# Patient Record
Sex: Male | Born: 1970 | Race: White | Hispanic: No | Marital: Single | Smoking: Current every day smoker
Health system: Southern US, Community
[De-identification: ages and names within clinical notes are randomized; demographics above are authoritative.]

---

## 1997-11-23 ENCOUNTER — Emergency Department (HOSPITAL_COMMUNITY): Admission: EM | Admit: 1997-11-23 | Discharge: 1997-11-23 | Payer: Self-pay | Admitting: Emergency Medicine

## 1998-01-01 ENCOUNTER — Ambulatory Visit (HOSPITAL_COMMUNITY): Admission: RE | Admit: 1998-01-01 | Discharge: 1998-01-01 | Payer: Self-pay | Admitting: General Surgery

## 1998-11-11 ENCOUNTER — Encounter: Payer: Self-pay | Admitting: Emergency Medicine

## 1998-11-11 ENCOUNTER — Emergency Department (HOSPITAL_COMMUNITY): Admission: EM | Admit: 1998-11-11 | Discharge: 1998-11-11 | Payer: Self-pay | Admitting: Emergency Medicine

## 2007-06-04 ENCOUNTER — Ambulatory Visit (HOSPITAL_COMMUNITY): Admission: RE | Admit: 2007-06-04 | Discharge: 2007-06-04 | Payer: Self-pay | Admitting: Emergency Medicine

## 2008-05-05 IMAGING — CT CT HEAD W/O CM
1 series · 16 of 30 positions shown, 20 images · non-contrast
Comparison: 

CLINICAL DATA: RECENT SYNCOPAL EPISODE; HEADACHE

CT HEAD WITHOUT CONTRAST
TECHNIQUE: Contiguous axial images were obtained from the base of
the skull through the vertex without contrast

[Series 2: brain · axial · 0.47mm/px · z∈[+121,+261]mm · 16 of 32 slices shown, 20 images]
[im 2/32  brain]
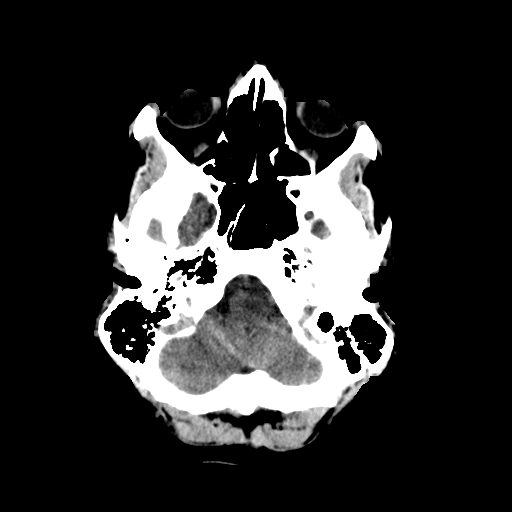
[im 2/32  bone]
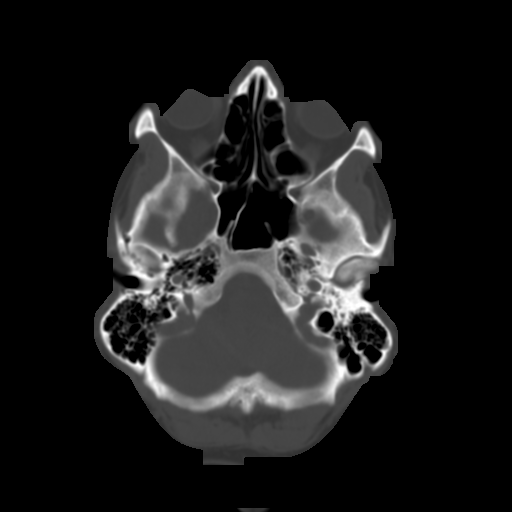
[im 4/32  brain]
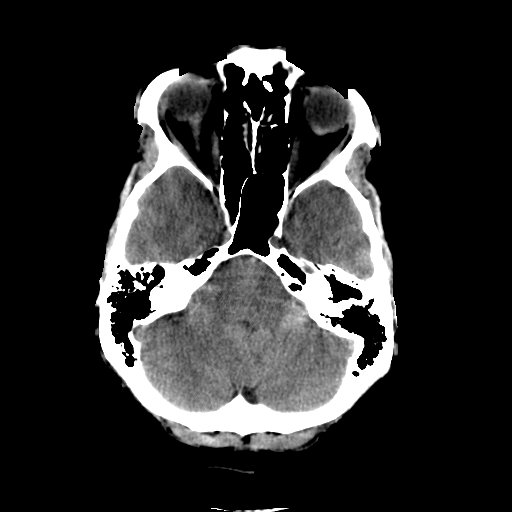
[im 6/32  brain]
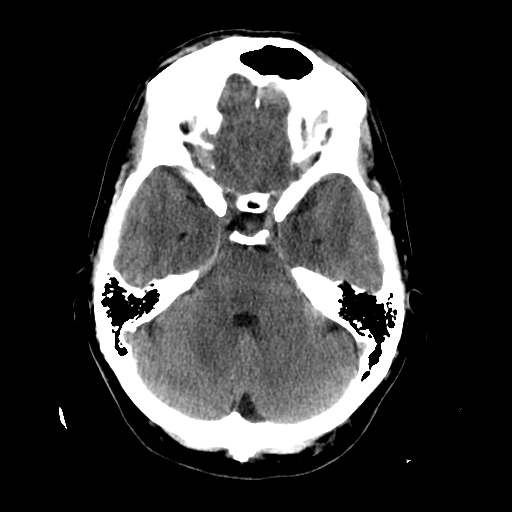
[im 8/32  brain]
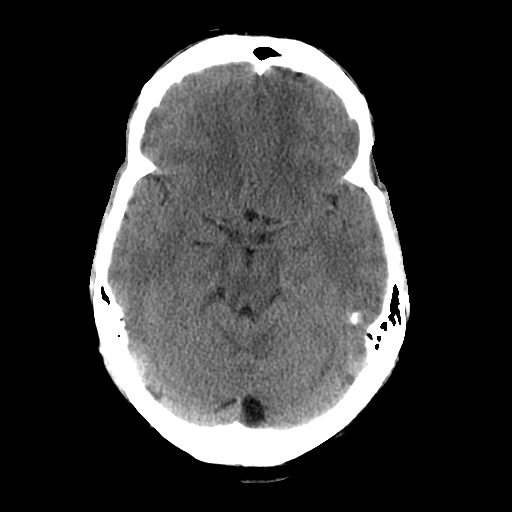
[im 9/32  brain]
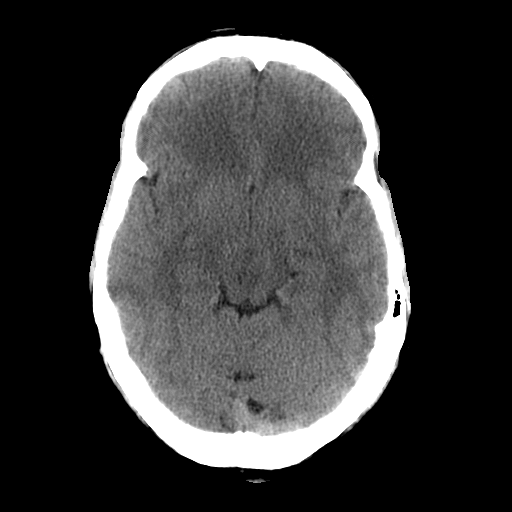
[im 9/32  bone]
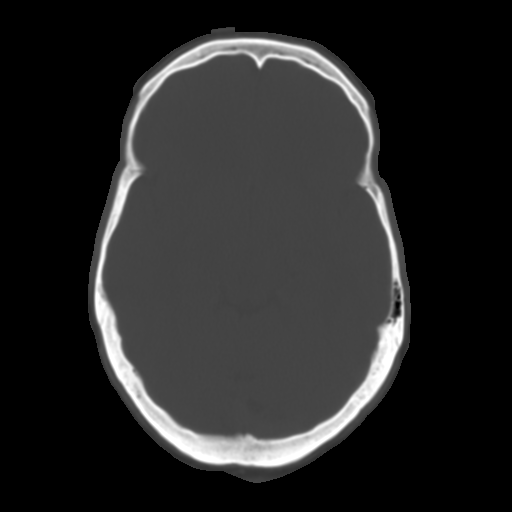
[im 11/32  brain]
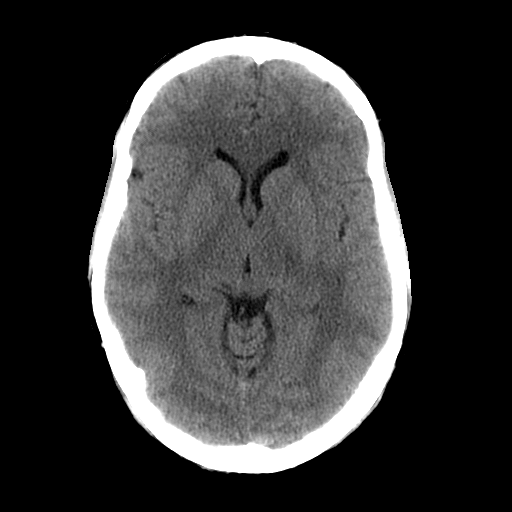
[im 13/32  brain]
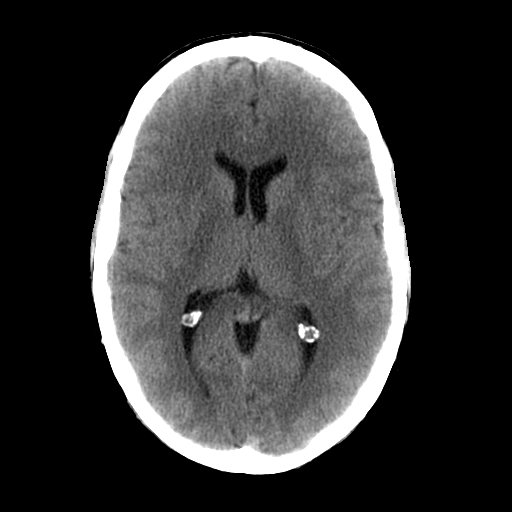
[im 15/32  brain]
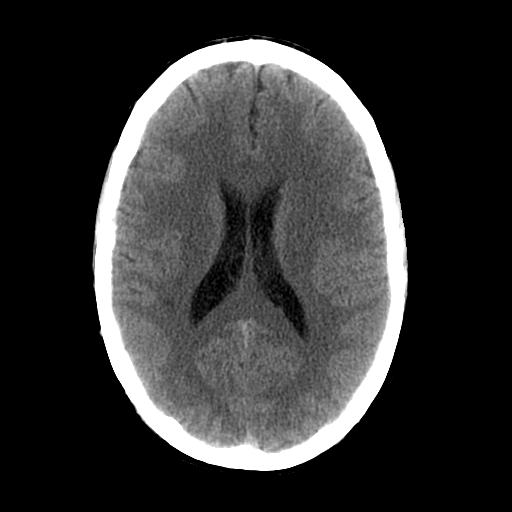
[im 17/32  brain]
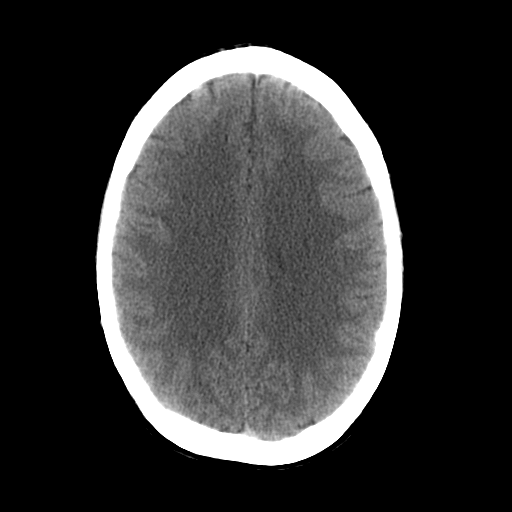
[im 17/32  bone]
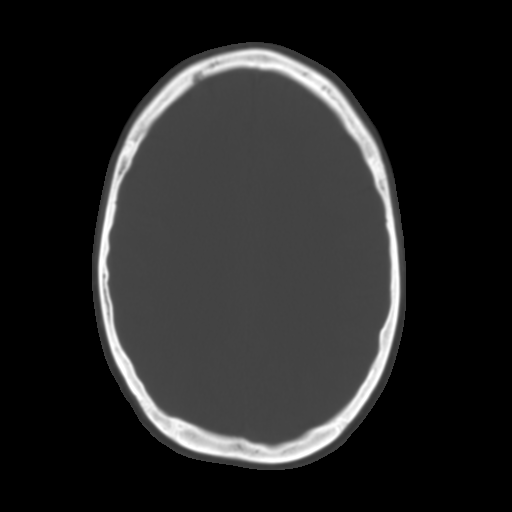
[im 19/32  brain]
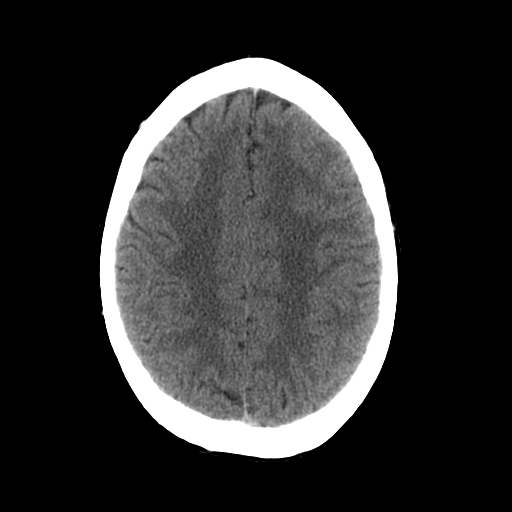
[im 21/32  brain]
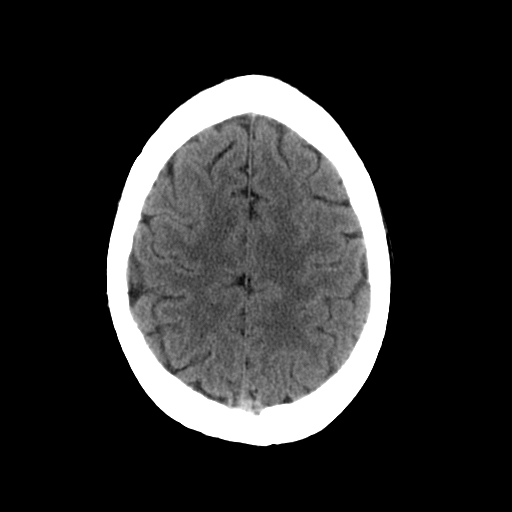
[im 23/32  brain]
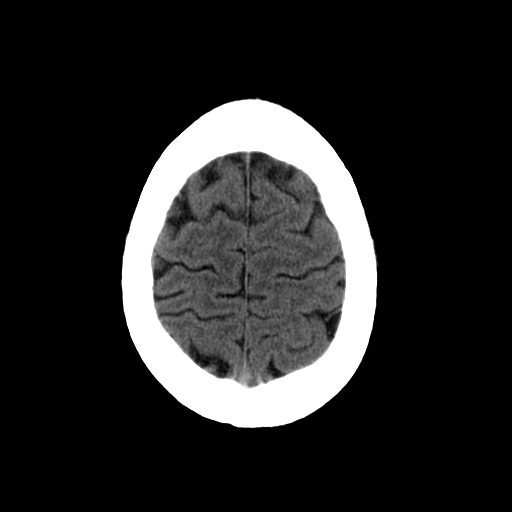
[im 24/32  brain]
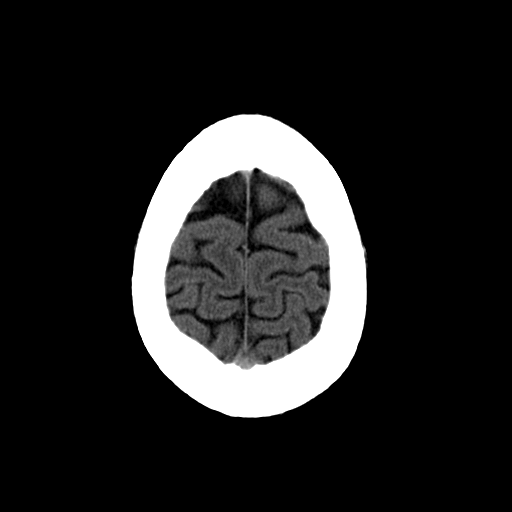
[im 24/32  bone]
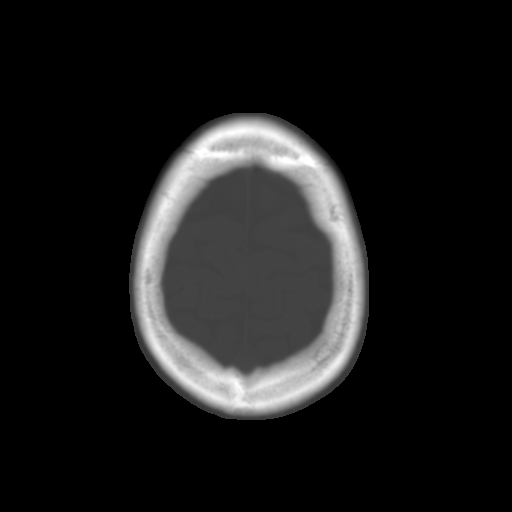
[im 26/32  brain]
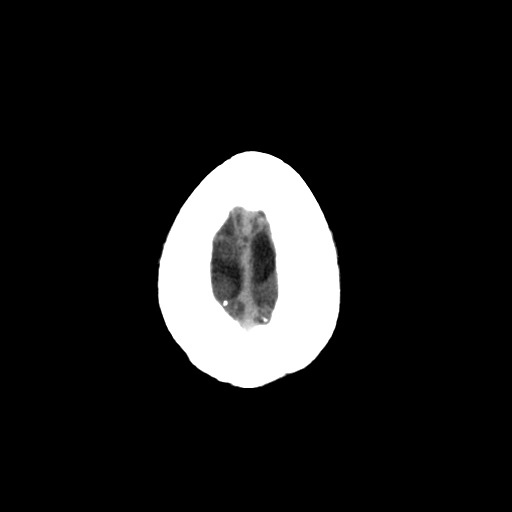
[im 28/32  brain]
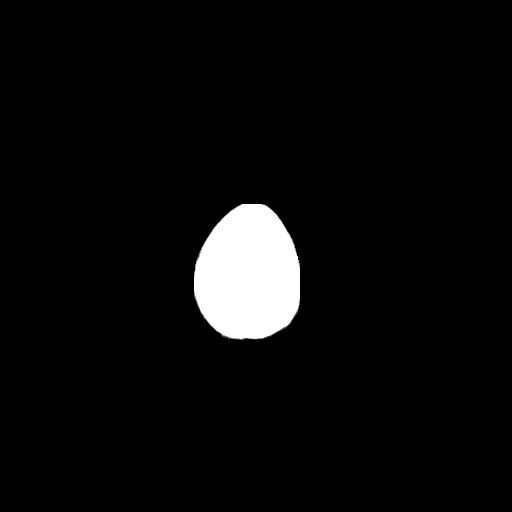
[im 30/32  brain]
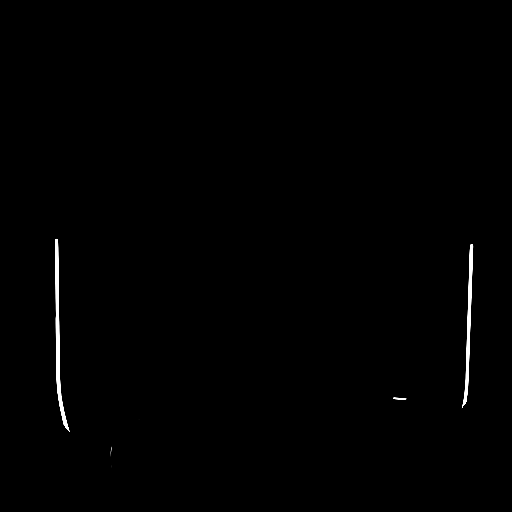

[16 of 30 positions shown; findings below may reference images not displayed]

FINDINGS: The brain has a normal appearance without evidence for
hemorrhage, acute infarction, hydrocephalus, or mass lesion.  There
is no extra axial fluid collection.  The skull and paranasal
sinuses are normal. Mucosal thickening involving the left maxillary
sinus.
IMPRESSION: Normal CT of the head without contrast.

## 2010-03-17 ENCOUNTER — Emergency Department (HOSPITAL_COMMUNITY)
Admission: EM | Admit: 2010-03-17 | Discharge: 2010-03-18 | Payer: Self-pay | Source: Home / Self Care | Admitting: Emergency Medicine

## 2010-03-22 LAB — DIFFERENTIAL
Basophils Absolute: 0 10*3/uL (ref 0.0–0.1)
Basophils Relative: 0 % (ref 0–1)
Eosinophils Absolute: 0 10*3/uL (ref 0.0–0.7)
Eosinophils Relative: 0 % (ref 0–5)
Lymphocytes Relative: 8 % — ABNORMAL LOW (ref 12–46)
Lymphs Abs: 1.4 10*3/uL (ref 0.7–4.0)
Monocytes Absolute: 1.4 10*3/uL — ABNORMAL HIGH (ref 0.1–1.0)
Monocytes Relative: 8 % (ref 3–12)
Neutro Abs: 13.7 10*3/uL — ABNORMAL HIGH (ref 1.7–7.7)
Neutrophils Relative %: 83 % — ABNORMAL HIGH (ref 43–77)

## 2010-03-22 LAB — URINALYSIS, ROUTINE W REFLEX MICROSCOPIC
Bilirubin Urine: NEGATIVE
Hgb urine dipstick: NEGATIVE
Ketones, ur: 15 mg/dL — AB
Nitrite: NEGATIVE
Protein, ur: NEGATIVE mg/dL
Specific Gravity, Urine: 1.03 (ref 1.005–1.030)
Urine Glucose, Fasting: NEGATIVE mg/dL
Urobilinogen, UA: 1 mg/dL (ref 0.0–1.0)
pH: 5.5 (ref 5.0–8.0)

## 2010-03-22 LAB — CBC
HCT: 43.2 % (ref 39.0–52.0)
Hemoglobin: 15.7 g/dL (ref 13.0–17.0)
MCH: 32.6 pg (ref 26.0–34.0)
MCHC: 36.3 g/dL — ABNORMAL HIGH (ref 30.0–36.0)
MCV: 89.6 fL (ref 78.0–100.0)
Platelets: 260 10*3/uL (ref 150–400)
RBC: 4.82 MIL/uL (ref 4.22–5.81)
RDW: 12.6 % (ref 11.5–15.5)
WBC: 16.6 10*3/uL — ABNORMAL HIGH (ref 4.0–10.5)

## 2010-03-22 LAB — BASIC METABOLIC PANEL
BUN: 14 mg/dL (ref 6–23)
CO2: 27 mEq/L (ref 19–32)
Calcium: 9.2 mg/dL (ref 8.4–10.5)
Chloride: 98 mEq/L (ref 96–112)
Creatinine, Ser: 0.89 mg/dL (ref 0.4–1.5)
GFR calc Af Amer: >60 mL/min (ref >60)
GFR calc non Af Amer: >60 mL/min (ref >60)
Glucose, Bld: 108 mg/dL — ABNORMAL HIGH (ref 70–99)
Potassium: 4.6 mEq/L (ref 3.5–5.1)
Sodium: 134 mEq/L — ABNORMAL LOW (ref 135–145)

## 2011-02-17 IMAGING — CR DG CHEST 2V
2 series · 2 of 2 positions shown · non-contrast
Comparison: None

CLINICAL DATA: Chest and back pain.

CHEST - 2 VIEW

[w chest pa]
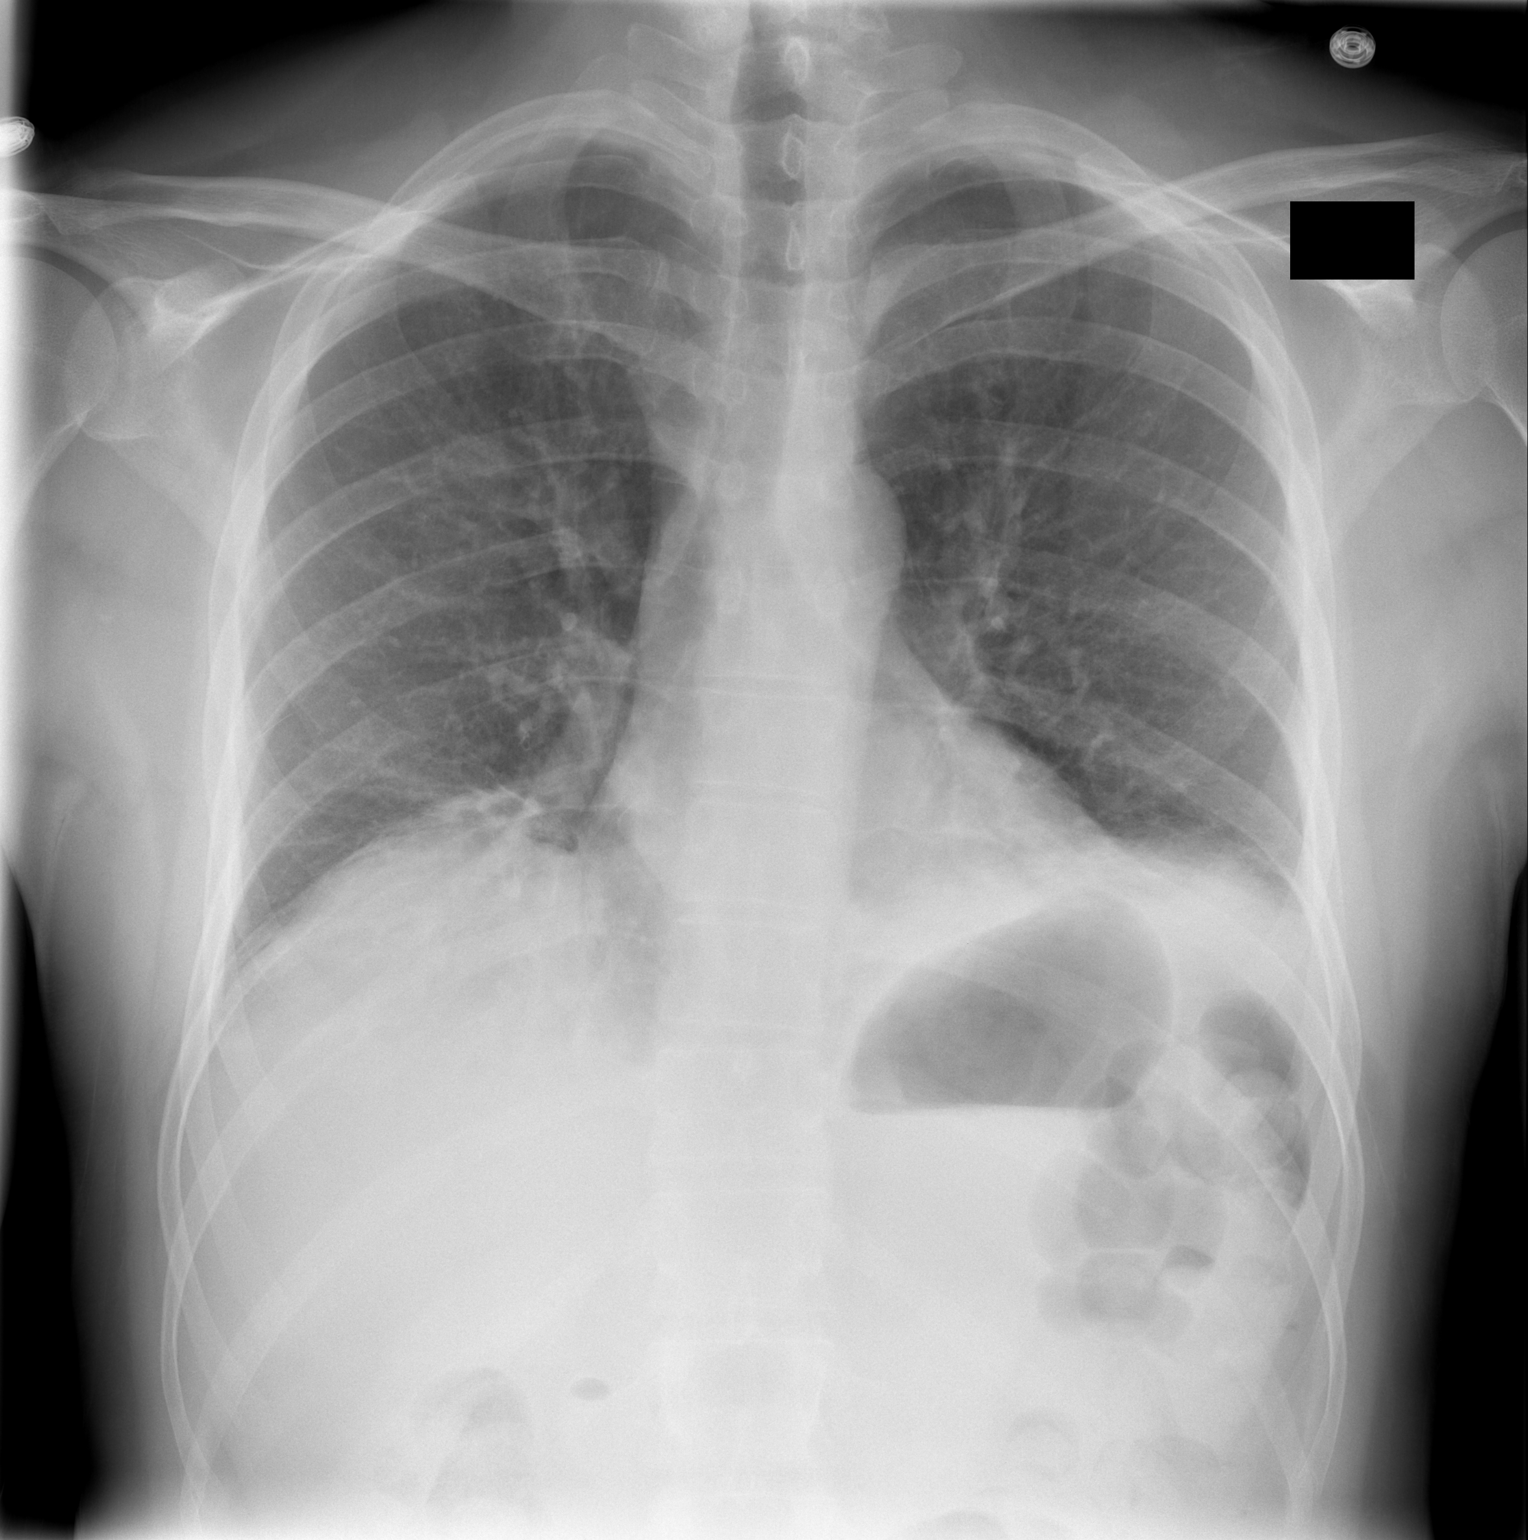

[w chest lat]
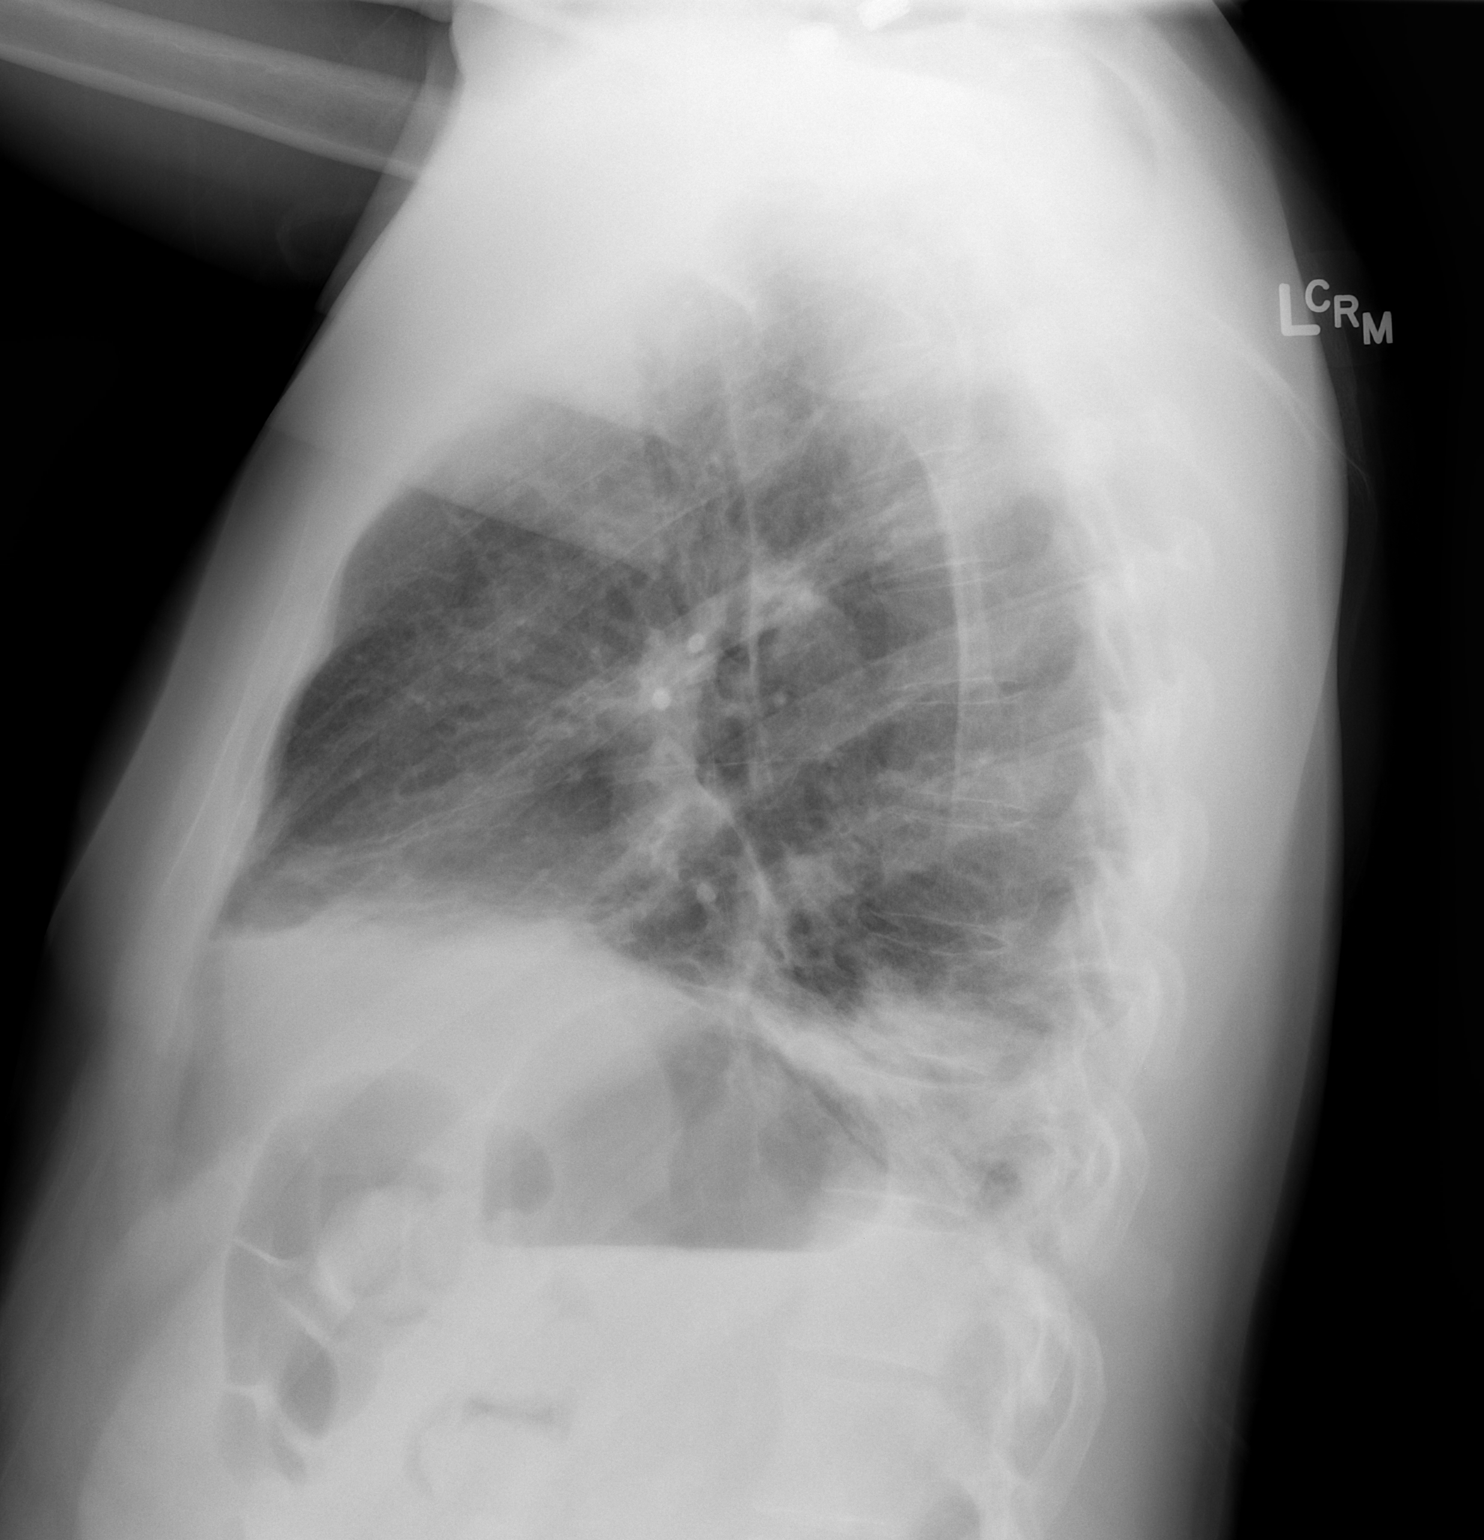

[2 of 2 positions shown; findings below may reference images not displayed]

FINDINGS: The cardiac silhouette, mediastinal and hilar contours
are within normal limits.  Low lung volumes with vascular crowding
and bibasilar atelectasis or infiltrates.  Probable small left
pleural effusion.  No pneumothorax.  The bony thorax is intact.
IMPRESSION: Bibasilar infiltrates versus atelectasis and probable small left
pleural effusion.

## 2011-05-07 ENCOUNTER — Encounter: Payer: Self-pay | Admitting: Family Medicine

## 2011-05-09 ENCOUNTER — Encounter: Payer: Self-pay | Admitting: Family Medicine

## 2011-05-09 ENCOUNTER — Ambulatory Visit (INDEPENDENT_AMBULATORY_CARE_PROVIDER_SITE_OTHER): Payer: BC Managed Care – PPO | Admitting: Family Medicine

## 2011-05-09 DIAGNOSIS — F418 Other specified anxiety disorders: Secondary | ICD-10-CM

## 2011-05-09 DIAGNOSIS — R5381 Other malaise: Secondary | ICD-10-CM

## 2011-05-09 DIAGNOSIS — L301 Dyshidrosis [pompholyx]: Secondary | ICD-10-CM

## 2011-05-09 DIAGNOSIS — E039 Hypothyroidism, unspecified: Secondary | ICD-10-CM

## 2011-05-09 DIAGNOSIS — G8929 Other chronic pain: Secondary | ICD-10-CM

## 2011-05-09 DIAGNOSIS — R5383 Other fatigue: Secondary | ICD-10-CM

## 2011-05-09 MED ORDER — HYDROCORTISONE VALERATE 0.2 % EX CREA: TOPICAL_CREAM | Freq: Two times a day (BID) | CUTANEOUS | Status: AC

## 2011-05-09 MED ORDER — LEVOTHYROXINE SODIUM 50 MCG PO TABS: 50.0000 ug | ORAL_TABLET | Freq: Every day | ORAL | Status: AC

## 2011-05-09 NOTE — Progress Notes (Signed)
  Subjective:    Patient ID: Kyle Booth, male    DOB: 1970/12/07, 41 y.o.   MRN: [id number]  HPI Patient presents with rash of hands and soles of feet.  Not pruritic however at times does burn.  Patient acknowledges that his hands sweat profusely particularly when he is nervous. Interested in dermatology refferal.  Has tried OTC cortisone without success.   Patient needing TSH check and refills of synthroid.  Chronic pain being cared for by [facility] pain center in [place], [place].  Advised by pain management to have testosterone level checked.  Evaluate mole on (L) temporal region that is changing in size and recently has cracked.   Review of Systems     Objective:   Physical Exam  Constitutional: He appears well-developed and well-nourished.  HENT:  Head: Normocephalic and atraumatic.  Eyes: EOM are normal.  Neck: Neck supple. No thyromegaly present.  Cardiovascular: Normal rate, regular rhythm and normal heart sounds.   Pulmonary/Chest: Effort normal and breath sounds normal.  Abdominal: Soft. Bowel sounds are normal.  Lymphadenopathy:    He has no cervical adenopathy.  Neurological: He is alert.  Skin: There is erythema (of (R) palm with peeling and fine vesiclar lesions).       (L) temporal region seborrhea keratosis present       Assessment & Plan:   1. Hypothyroid  TSH, levothyroxine (SYNTHROID, LEVOTHROID) 50 MCG tablet  2. Fatigue  Check testosterone levels  3. Eczema, dyshidrotic  hydrocortisone valerate cream (WESTCORT) 0.2 %, Ambulatory referral to Dermatology  4. Depression with anxiety    5. Chronic pain  Keep follow up with [facility] chronic pain center

## 2011-05-10 ENCOUNTER — Encounter: Payer: Self-pay | Admitting: Family Medicine

## 2011-05-10 LAB — TSH: TSH: 0.938 u[IU]/mL (ref 0.350–4.500)

## 2011-05-11 LAB — TESTOSTERONE: Testosterone: 130.96 ng/dL — ABNORMAL LOW (ref 250–890)

## 2011-07-11 ENCOUNTER — Ambulatory Visit (INDEPENDENT_AMBULATORY_CARE_PROVIDER_SITE_OTHER): Payer: BC Managed Care – PPO | Admitting: Family Medicine

## 2011-07-11 ENCOUNTER — Encounter: Payer: Self-pay | Admitting: Family Medicine

## 2011-07-11 VITALS — BP 124/79 | HR 76 | Temp 97.8°F | Resp 16 | Ht <= 58 in | Wt 193.6 lb

## 2011-07-11 DIAGNOSIS — E291 Testicular hypofunction: Secondary | ICD-10-CM

## 2011-07-11 DIAGNOSIS — E236 Other disorders of pituitary gland: Secondary | ICD-10-CM

## 2011-07-11 MED ORDER — TESTOSTERONE 20.25 MG/ACT (1.62%) TD GEL: 40.5000 [IU] | TRANSDERMAL | Status: DC

## 2011-07-13 NOTE — Progress Notes (Signed)
  Subjective:    Patient ID: Kyle Booth, male    DOB: [birth year], 41 y.o.   MRN: [id number]  HPI  Patient presents in follow up of low testosterone level.   Review of Systems     Objective:   Physical Exam  Nursing note and vitals reviewed. Constitutional: He appears well-developed.  Neurological: He is alert.       Assessment & Plan:   1. Hypogonadism male  Testosterone (ANDROGEL PUMP) 20.25 MG/ACT (1.62%) GEL    Discussion of hypogonadism and testosterone replacement. Side effects and risks of hormonal replacement reviewed. Follow up to recheck testosterone level in 6-8 weeks.

## 2011-07-14 NOTE — Progress Notes (Signed)
Prior auth completed and approved for life of policy for pt's androgel

## 2011-08-08 ENCOUNTER — Ambulatory Visit (INDEPENDENT_AMBULATORY_CARE_PROVIDER_SITE_OTHER): Payer: BC Managed Care – PPO | Admitting: Family Medicine

## 2011-08-08 ENCOUNTER — Encounter: Payer: Self-pay | Admitting: Family Medicine

## 2011-08-08 VITALS — BP 123/79 | HR 76 | Temp 97.9°F | Resp 16 | Ht 72.0 in | Wt 191.8 lb

## 2011-08-08 DIAGNOSIS — E236 Other disorders of pituitary gland: Secondary | ICD-10-CM

## 2011-08-08 DIAGNOSIS — E291 Testicular hypofunction: Secondary | ICD-10-CM

## 2011-08-08 MED ORDER — TESTOSTERONE 20.25 MG/ACT (1.62%) TD GEL: 40.5000 [IU] | TRANSDERMAL | Status: AC

## 2011-08-08 NOTE — Progress Notes (Signed)
  Subjective:    Patient ID: Kyle Booth, male    DOB: [birth year], 41 y.o.   MRN: [id number]  HPI  Patient presents in follow up of hypogonadism.  Patient states he noticed a significant improvement in energy, motivation and libido with testosterone replacement. Several missed doses.  Here for testosterone level   Review of Systems     Objective:   Physical Exam  Constitutional: He appears well-developed.  Neck: Neck supple.  Neurological: He is alert.  Psychiatric: He has a normal mood and affect.        Assessment & Plan:   1. Hypogonadism male  Testosterone, free, total, Testosterone (ANDROGEL PUMP) 20.25 MG/ACT (1.62%) GEL   Anticipatory guidance

## 2011-08-09 LAB — TESTOSTERONE, FREE, TOTAL, SHBG
Sex Hormone Binding: 28 nmol/L (ref 13–71)
Testosterone, Free: 44.4 pg/mL — ABNORMAL LOW (ref 47.0–244.0)
Testosterone-% Free: 2.1 % (ref 1.6–2.9)
Testosterone: 211.86 ng/dL — ABNORMAL LOW (ref 300–890)

## 2011-08-14 ENCOUNTER — Telehealth: Payer: Self-pay | Admitting: Radiology

## 2011-08-14 NOTE — Telephone Encounter (Signed)
Message copied by Luretha Murphy on Sun Aug 14, 2011 10:40 AM ------      Message from: Dois Davenport      Created: Sat Aug 13, 2011  9:22 AM       Please contact patient. Testosterone level improving.  Will continue current dose as patient symptomatically was much better at recent OV. Recommend rechecking level in 3 months.

## 2011-08-14 NOTE — Telephone Encounter (Signed)
lmom for pt to cb

## 2011-09-24 ENCOUNTER — Ambulatory Visit (INDEPENDENT_AMBULATORY_CARE_PROVIDER_SITE_OTHER): Payer: BC Managed Care – PPO | Admitting: Emergency Medicine

## 2011-09-24 VITALS — BP 130/78 | HR 82 | Temp 97.8°F | Resp 16 | Ht 72.0 in | Wt 192.0 lb

## 2011-09-24 DIAGNOSIS — K0889 Other specified disorders of teeth and supporting structures: Secondary | ICD-10-CM

## 2011-09-24 DIAGNOSIS — K089 Disorder of teeth and supporting structures, unspecified: Secondary | ICD-10-CM

## 2011-09-24 NOTE — Progress Notes (Signed)
Date:  09/24/2011   Name:  Kyle Booth   DOB:  1970/04/08   MRN:  [id number]  PCP:  No primary provider on file.    Chief Complaint: Oral Pain   History of Present Illness:  Kyle Booth is a 41 y.o. very pleasant male patient who presents with the following:  History of chronic narcotic use in pain management.    Has a one month history of pain in his upper jaw especially his left maxillary incisors through his pre molar.  Seen Thursday by a dentist who said his teeth were "fine".  Patient is unable to sleep due to pain despite the use of two morpine derivatives daily.  He has a history of sinusitis but no symptoms of sinusitis; fever, drainage, discharge, congestion, sore throat, cough or pressure.  He requested something for pain.  There is no problem list on file for this patient.   No past medical history on file.  No past surgical history on file.  History  Substance Use Topics  . Smoking status: Current Everyday Smoker -- 1.0 packs/day  . Smokeless tobacco: Not on file  . Alcohol Use: Not on file    No family history on file.  Allergies  Allergen Reactions  . Nsaids   . Celebrex (Celecoxib) Rash    Medication list has been reviewed and updated.  Current Outpatient Prescriptions on File Prior to Visit  Medication Sig Dispense Refill  . ALPRAZolam (XANAX) 0.5 MG tablet Take 0.5 mg by mouth daily.      Marland Kitchen gabapentin (NEURONTIN) 300 MG capsule Take 300 mg by mouth 2 (two) times daily.      . hydrocortisone valerate cream (WESTCORT) 0.2 % Apply topically 2 (two) times daily.  45 g  0  . levothyroxine (SYNTHROID, LEVOTHROID) 50 MCG tablet Take 1 tablet (50 mcg total) by mouth daily.  90 tablet  3  . morphine (MSIR) 30 MG tablet Take 30 mg by mouth daily.      . naltrexone (DEPADE) 50 MG tablet Take 50 mg by mouth daily.      Marland Kitchen oxymorphone (OPANA) 10 MG tablet Take 10 mg by mouth every 4 (four) hours as needed.      . Testosterone (ANDROGEL PUMP) 20.25 MG/ACT  (1.62%) GEL Place 40.5 Units onto the skin 1 day or 1 dose.  75 g  2  . venlafaxine (EFFEXOR) 75 MG tablet Take 225 mg by mouth daily.      Marland Kitchen morphine (MS CONTIN) 15 MG 12 hr tablet Take 15 mg by mouth 4 (four) times daily.        Review of Systems:  As per HPI, otherwise negative.    Physical Examination: Filed Vitals:   09/24/11 1746  BP: 130/78  Pulse: 82  Temp: 97.8 F (36.6 C)  Resp: 16   Filed Vitals:   09/24/11 1746  Height: 6' (1.829 m)  Weight: 192 lb (87.091 kg)   Body mass index is 26.04 kg/(m^2). Ideal Body Weight: Weight in (lb) to have BMI = 25: 183.9    GEN: WDWN, NAD, Non-toxic, Alert & Oriented x 3 HEENT: Atraumatic, Normocephalic.  Ears and Nose: No external deformity.  No tenderness of maxillary nor frontal sinuses.  No drainage, postnasal discharge nor nasal congestion.  Oropharynx negative.  Dentition appears intact with no abscess, caries, gingivitis. EXTR: No clubbing/cyanosis/edema NEURO: Normal gait.  PSYCH: Normally interactive. Conversant. Not depressed or anxious appearing.  Calm demeanor.    Assessment and Plan: Dental  pain Follow up with dentist  Was offered empirical antibiotic and decongestant after he teared up and cried.  I explained that I would not and could not add to his narcotic regimen without endangering his standing in his pain management program.  Carmelina Dane, MD

## 2011-11-29 ENCOUNTER — Ambulatory Visit (INDEPENDENT_AMBULATORY_CARE_PROVIDER_SITE_OTHER): Payer: BC Managed Care – PPO | Admitting: Emergency Medicine

## 2011-11-29 VITALS — BP 132/82 | HR 97 | Temp 98.2°F | Resp 17 | Ht 72.0 in | Wt 190.0 lb

## 2011-11-29 DIAGNOSIS — E291 Testicular hypofunction: Secondary | ICD-10-CM

## 2011-11-29 DIAGNOSIS — E349 Endocrine disorder, unspecified: Secondary | ICD-10-CM

## 2011-11-29 DIAGNOSIS — G589 Mononeuropathy, unspecified: Secondary | ICD-10-CM

## 2011-11-29 NOTE — Progress Notes (Signed)
Date:  11/29/2011   Name:  Kyle Booth   DOB:  Jul 10, 1970   MRN:  [id number] Gender: male Age: 41 y.o.  PCP:  Shade Flood, MD    Chief Complaint: Hand Pain, Arm Pain, Shoulder Pain and Results   History of Present Illness:  Kyle Booth is a 41 y.o. pleasant patient who presents with the following:  In chronic pain program for shoulder, hand and toe pain; central pain syndrome.  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  No nocturnal awakening.  Had a NCS/EMG 5 years or so ago and it was suggested that he may have CTS.  Denies pain in his fingers, rather the pain originates in his right neck and radiates into his interdigital web on his right hand.  No associated trauma.  There is no problem list on file for this patient.   No past medical history on file.  No past surgical history on file.  History  Substance Use Topics  . Smoking status: Current Every Day Smoker -- 1.0 packs/day for 25 years    Types: Cigarettes  . Smokeless tobacco: Not on file  . Alcohol Use: Not on file    No family history on file.  Allergies  Allergen Reactions  . Nsaids   . Celebrex (Celecoxib) Rash    Medication list has been reviewed and updated.  Outpatient Prescriptions Prior to Visit  Medication Sig Dispense Refill  . ALPRAZolam (XANAX) 0.5 MG tablet Take 0.5 mg by mouth daily.      Marland Kitchen gabapentin (NEURONTIN) 300 MG capsule Take 300 mg by mouth 2 (two) times daily.      . hydrocortisone valerate cream (WESTCORT) 0.2 % Apply topically 2 (two) times daily.  45 g  0  . levothyroxine (SYNTHROID, LEVOTHROID) 50 MCG tablet Take 1 tablet (50 mcg total) by mouth daily.  90 tablet  3  . morphine (MS CONTIN) 15 MG 12 hr tablet Take 15 mg by mouth 4 (four) times daily.      Marland Kitchen morphine (MSIR) 30 MG tablet Take 30 mg by mouth daily.      . naltrexone (DEPADE) 50 MG tablet Take 50 mg by mouth daily.      Marland Kitchen  oxymorphone (OPANA) 10 MG tablet Take 10 mg by mouth every 4 (four) hours as needed.      . Testosterone (ANDROGEL PUMP) 20.25 MG/ACT (1.62%) GEL Place 40.5 Units onto the skin 1 day or 1 dose.  75 g  2  . venlafaxine (EFFEXOR) 75 MG tablet Take 225 mg by mouth daily.        Review of Systems:  As per HPI, otherwise negative.    Physical Examination: Filed Vitals:   11/29/11 1654  BP: 132/82  Pulse: 97  Temp: 98.2 F (36.8 C)  Resp: 17   Filed Vitals:   11/29/11 1654  Height: 6' (1.829 m)  Weight: 190 lb (86.183 kg)   Body mass index is 25.77 kg/(m^2). Ideal Body Weight: Weight in (lb) to have BMI = 25: 183.9   GEN: WDWN, NAD, Non-toxic, A & O x 3 HEENT: Atraumatic, Normocephalic. Neck supple. No masses, No LAD. Ears and Nose: No external deformity. CV: RRR, No M/G/R. No JVD. No thrill. No extra heart sounds. PULM: CTA B, no wheezes, crackles, rhonchi. No retractions. No resp. distress. No accessory muscle use. ABD: S, NT, ND, +BS. No rebound.  No HSM. EXTR: No c/c/e.  Tinnel and phalen negative. NEURO Normal gait. Weak interosseous muscles. PSYCH: Normally interactive. Conversant. Not depressed or anxious appearing.  Calm demeanor.    Assessment and Plan: monoeuropathy NCS/EMG Testorterone level sollow up as needed  Carmelina Dane, MD I have reviewed and agree with documentation. Robert P. Merla Riches, M.D.

## 2011-12-01 LAB — TESTOSTERONE: Testosterone: 438.19 ng/dL (ref 300–890)

## 2016-01-25 ENCOUNTER — Ambulatory Visit (INDEPENDENT_AMBULATORY_CARE_PROVIDER_SITE_OTHER): Payer: Self-pay | Admitting: Orthopaedic Surgery

## 2016-02-12 ENCOUNTER — Ambulatory Visit (INDEPENDENT_AMBULATORY_CARE_PROVIDER_SITE_OTHER): Payer: Self-pay | Admitting: Orthopaedic Surgery

## 2017-02-14 ENCOUNTER — Ambulatory Visit (INDEPENDENT_AMBULATORY_CARE_PROVIDER_SITE_OTHER): Payer: 59

## 2017-02-14 ENCOUNTER — Encounter (INDEPENDENT_AMBULATORY_CARE_PROVIDER_SITE_OTHER): Payer: Self-pay | Admitting: Orthopaedic Surgery

## 2017-02-14 ENCOUNTER — Ambulatory Visit (INDEPENDENT_AMBULATORY_CARE_PROVIDER_SITE_OTHER): Payer: 59 | Admitting: Orthopaedic Surgery

## 2017-02-14 VITALS — BP 131/83 | HR 61 | Ht 73.0 in | Wt 200.0 lb

## 2017-02-14 DIAGNOSIS — M545 Low back pain, unspecified: Secondary | ICD-10-CM
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
## 2017-03-14 ENCOUNTER — Ambulatory Visit (INDEPENDENT_AMBULATORY_CARE_PROVIDER_SITE_OTHER): Payer: 59 | Admitting: Orthopaedic Surgery

## 2019-03-13 DIAGNOSIS — F411 Generalized anxiety disorder: Secondary | ICD-10-CM | POA: Diagnosis not present

## 2019-04-03 DIAGNOSIS — F411 Generalized anxiety disorder: Secondary | ICD-10-CM | POA: Diagnosis not present

## 2021-05-11 ENCOUNTER — Other Ambulatory Visit: Payer: Self-pay

## 2021-05-11 ENCOUNTER — Ambulatory Visit: Payer: Commercial Managed Care - PPO | Admitting: Dermatology

## 2021-05-11 DIAGNOSIS — L905 Scar conditions and fibrosis of skin: Secondary | ICD-10-CM

## 2021-05-11 DIAGNOSIS — Z85828 Personal history of other malignant neoplasm of skin: Secondary | ICD-10-CM | POA: Diagnosis not present

## 2021-05-11 DIAGNOSIS — L738 Other specified follicular disorders: Secondary | ICD-10-CM

## 2021-05-11 DIAGNOSIS — L820 Inflamed seborrheic keratosis: Secondary | ICD-10-CM | POA: Diagnosis not present

## 2021-05-11 DIAGNOSIS — D485 Neoplasm of uncertain behavior of skin: Secondary | ICD-10-CM

## 2021-05-11 DIAGNOSIS — D1801 Hemangioma of skin and subcutaneous tissue: Secondary | ICD-10-CM | POA: Diagnosis not present

## 2021-05-11 DIAGNOSIS — Z1283 Encounter for screening for malignant neoplasm of skin: Secondary | ICD-10-CM | POA: Diagnosis not present

## 2021-05-11 DIAGNOSIS — L918 Other hypertrophic disorders of the skin: Secondary | ICD-10-CM

## 2021-05-11 NOTE — Patient Instructions (Signed)

## 2021-05-22 ENCOUNTER — Encounter: Payer: Self-pay | Admitting: Dermatology
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
## 2022-05-17 ENCOUNTER — Ambulatory Visit: Payer: Commercial Managed Care - PPO | Admitting: Dermatology

## 2024-03-04 ENCOUNTER — Telehealth: Payer: Self-pay | Admitting: Acute Care

## 2024-03-04 DIAGNOSIS — F17210 Nicotine dependence, cigarettes, uncomplicated: Secondary | ICD-10-CM

## 2024-03-04 DIAGNOSIS — Z122 Encounter for screening for malignant neoplasm of respiratory organs: Secondary | ICD-10-CM

## 2024-03-04 DIAGNOSIS — Z87891 Personal history of nicotine dependence: Secondary | ICD-10-CM

## 2024-03-04 NOTE — Telephone Encounter (Signed)
 Lung Cancer Screening Narrative/Criteria Questionnaire (Cigarette Smokers Only- No Cigars/Pipes/vapes)   Kyle Booth   SDMV:03/11/2024 11:00 [name]       1970-04-02   LDCT: Patient will call back to schedule     53 y.o.   Phone: [phone]  Lung Screening Narrative (confirm age 54-77 yrs Medicare / 50-80 yrs Private pay insurance)   Insurance information:[employer]   Referring Provider:Dr. Auston    This screening involves an initial phone call with a team member from our program. It is called a shared decision making visit. The initial meeting is required by  insurance and Medicare to make sure you understand the program. This appointment takes about 15-20 minutes to complete. You will complete the screening scan at your scheduled date/time.  This scan takes about 5-10 minutes to complete. You can eat and drink normally before and after the scan.  Criteria questions for Lung Cancer Screening:   Are you a current or former smoker? Current Age began smoking: 53yo   If you are a former smoker, what year did you quit smoking? N/A(within 15 yrs)   To calculate your smoking history, I need an accurate estimate of how many packs of cigarettes you smoked per day and for how many years. (Not just the number of PPD you are now smoking)   Years smoking 40 x Packs per day 1 = Pack years 40   (at least 20 pack yrs)   (Make sure they understand that we need to know how much they have smoked in the past, not just the number of PPD they are smoking now)  Do you have a personal history of cancer?  No    Do you have a family history of cancer? No  Are you coughing up blood?  No  Have you had unexplained weight loss of 15 lbs or more in the last 6 months? No  It looks like you meet all criteria.  When would be a good time for us  to schedule you for this screening?   Additional information: N/A

## 2024-03-11 ENCOUNTER — Encounter: Payer: Self-pay | Admitting: *Deleted

## 2024-03-11 ENCOUNTER — Ambulatory Visit (INDEPENDENT_AMBULATORY_CARE_PROVIDER_SITE_OTHER): Admitting: *Deleted

## 2024-03-11 DIAGNOSIS — F17210 Nicotine dependence, cigarettes, uncomplicated: Secondary | ICD-10-CM | POA: Diagnosis not present

## 2024-03-11 NOTE — Progress Notes (Signed)
 Virtual Visit via Video Note  I connected with Kyle Booth on 03/11/2024 at 11:00 AM EST by a video enabled telemedicine application and verified that I am speaking with the correct person using two identifiers.  Location: Patient: in home Provider: [address] [place], [place], [address]     Shared Decision Making Visit Lung Cancer Screening Program [phone])   Eligibility: Age 54 y.o. Pack Years Smoking History Calculation 40 (# packs/per year x # years smoked) Recent History of coughing up blood  no Unexplained weight loss? no ( >Than 15 pounds within the last 6 months ) Prior History Lung / other cancer no (Diagnosis within the last 5 years already requiring surveillance chest CT Scans). Smoking Status Current Smoker Former Smokers: Years since quit:  NA  Quit Date: NA  Visit Components: Discussion included one or more decision making aids. yes Discussion included risk/benefits of screening. yes Discussion included potential follow up diagnostic testing for abnormal scans. yes Discussion included meaning and risk of over diagnosis. yes Discussion included meaning and risk of False Positives. yes Discussion included meaning of total radiation exposure. yes  Counseling Included: Importance of adherence to annual lung cancer LDCT screening. yes Impact of comorbidities on ability to participate in the program. yes Ability and willingness to under diagnostic treatment. yes  Smoking Cessation Counseling: Current Smokers:  Discussed importance of smoking cessation. yes Information about tobacco cessation classes and interventions provided to patient. yes Patient provided with ticket for LDCT Scan. yes Symptomatic Patient. no  Counseling NA Diagnosis Code: Tobacco Use Z72.0 Asymptomatic Patient yes  Counseling (Intermediate counseling: > three minutes counseling) H9563  Counseled patient 4 minutes regarding tobacco use.   Former Smokers:  Discussed the  importance of maintaining cigarette abstinence. yes Diagnosis Code: Personal History of Nicotine Dependence. S12.108 Information about tobacco cessation classes and interventions provided to patient. Yes Patient provided with ticket for LDCT Scan. yes Written Order for Lung Cancer Screening with LDCT placed in Epic. Yes (CT Chest Lung Cancer Screening Low Dose W/O CM) [id number] Z12.2-Screening of respiratory organs Z87.891-Personal history of nicotine dependence   Josette Ranger, RN 03/11/2024

## 2024-03-11 NOTE — Patient Instructions (Signed)
 [address] [place] [phone]  Thank you for participating in the [facility]. It was our pleasure to meet you today. We will call you with the results of your scan within the next few days. Your scan will be assigned a Lung RADS category score by the physicians reading the scans.  This Lung RADS score determines follow up scanning.  See below for description of categories, and follow up screening recommendations. We will be in touch to schedule your follow up screening annually or based on recommendations of our providers. We will fax a copy of your scan results to your Primary Care Physician, or the physician who referred you to the program, to ensure they have the results. Please call the office if you have any questions or concerns regarding your scanning experience or results.  Our office number is [phone]. Please speak with Karna Curly, RN., Karna Doom RN, or [facility] RN, and Isaiah Dover RN. They are  our Lung Cancer Screening RN.'s If They are unavailable when you call, Please leave a message on the voice mail. We will return your call at our earliest convenience.This voice mail is monitored several times a day.  Remember, if your scan is normal, we will scan you annually as long as you continue to meet the criteria for the program. (Age 61-80, Current smoker or smoker who has quit within the last 15 years). If you are a smoker, remember, quitting is the single most powerful action that you can take to decrease your risk of lung cancer and other pulmonary, breathing related problems. We know quitting is hard, and we are here to help.  Please let us  know if there is anything we can do to help you meet your goal of quitting. If you are a former smoker, counselling psychologist. We are proud of you! Remain smoke free! Remember you can refer friends or family members through the number above.  We will screen them to make sure they meet criteria  for the program. Thank you for helping us  take better care of you by participating in Lung Screening.  For Virtual Smoking Cessation Classes , The [employer] Provides  Freedom From Smoking Classes.  Please search their website for dates and times.    Lung RADS Categories:  Lung RADS 1: no nodules or definitely non-concerning nodules.  Recommendation is for a repeat annual scan in 12 months.  Lung RADS 2:  nodules that are non-concerning in appearance and behavior with a very low likelihood of becoming an active cancer. Recommendation is for a repeat annual scan in 12 months.  Lung RADS 3: nodules that are probably non-concerning , includes nodules with a low likelihood of becoming an active cancer.  Recommendation is for a 68-month repeat screening scan. Often noted after an upper respiratory illness. We will be in touch to make sure you have no questions, and to schedule your 96-month scan.  Lung RADS 4 A: nodules with concerning findings, recommendation is most often for a follow up scan in 3 months or additional testing based on our provider's assessment of the scan. We will be in touch to make sure you have no questions and to schedule the recommended 3 month follow up scan.  Lung RADS 4 B:  indicates findings that are concerning. We will be in touch with you to schedule additional diagnostic testing based on our provider's  assessment of the scan.  You can receive free nicotine replacement therapy ( patches, gum  or mints) by calling 1-800-QUIT NOW. Please call so we can get you on the path to becoming  a non-smoker. I know it is hard, but you can do this!  Other options for assistance in smoking cessation ( As covered by your insurance benefits)  Hypnosis for smoking cessation  [employer] [phone]  Acupuncture for smoking cessation  [employer] [phone]

## 2024-03-19 ENCOUNTER — Encounter

## 2024-03-19 DIAGNOSIS — F17210 Nicotine dependence, cigarettes, uncomplicated: Secondary | ICD-10-CM

## 2024-03-19 DIAGNOSIS — Z122 Encounter for screening for malignant neoplasm of respiratory organs: Secondary | ICD-10-CM

## 2024-03-19 DIAGNOSIS — Z87891 Personal history of nicotine dependence: Secondary | ICD-10-CM

## 2024-03-28 ENCOUNTER — Other Ambulatory Visit: Payer: Self-pay

## 2024-03-28 DIAGNOSIS — Z87891 Personal history of nicotine dependence: Secondary | ICD-10-CM

## 2024-03-28 DIAGNOSIS — F17210 Nicotine dependence, cigarettes, uncomplicated: Secondary | ICD-10-CM

## 2024-03-28 DIAGNOSIS — Z122 Encounter for screening for malignant neoplasm of respiratory organs: Secondary | ICD-10-CM

## 2024-05-07 ENCOUNTER — Ambulatory Visit: Admitting: Dermatology
